# Patient Record
Sex: Female | Born: 1965 | Race: Black or African American | Marital: Single | Smoking: Current every day smoker
Health system: Southern US, Community
[De-identification: ages and names within clinical notes are randomized; demographics above are authoritative.]

## PROBLEM LIST (undated history)

## (undated) DIAGNOSIS — T7840XA Allergy, unspecified, initial encounter: Secondary | ICD-10-CM

## (undated) DIAGNOSIS — M1990 Unspecified osteoarthritis, unspecified site: Secondary | ICD-10-CM

## (undated) HISTORY — DX: Unspecified osteoarthritis, unspecified site: M19.90

## (undated) HISTORY — PX: TUBAL LIGATION: SHX77

## (undated) HISTORY — DX: Allergy, unspecified, initial encounter: T78.40XA

---

## 2017-09-30 ENCOUNTER — Other Ambulatory Visit: Payer: Self-pay

## 2017-09-30 ENCOUNTER — Encounter: Payer: Self-pay | Admitting: Nurse Practitioner

## 2017-09-30 ENCOUNTER — Ambulatory Visit: Payer: BLUE CROSS/BLUE SHIELD | Attending: Nurse Practitioner | Admitting: Nurse Practitioner

## 2017-09-30 VITALS — BP 156/130 | HR 66 | Temp 98.2°F | Resp 16 | Ht 66.5 in | Wt 210.0 lb

## 2017-09-30 DIAGNOSIS — G894 Chronic pain syndrome: Secondary | ICD-10-CM | POA: Insufficient documentation

## 2017-09-30 DIAGNOSIS — M899 Disorder of bone, unspecified: Secondary | ICD-10-CM | POA: Insufficient documentation

## 2017-09-30 DIAGNOSIS — Z882 Allergy status to sulfonamides status: Secondary | ICD-10-CM | POA: Insufficient documentation

## 2017-09-30 DIAGNOSIS — F1190 Opioid use, unspecified, uncomplicated: Secondary | ICD-10-CM | POA: Diagnosis not present

## 2017-09-30 DIAGNOSIS — M25512 Pain in left shoulder: Secondary | ICD-10-CM

## 2017-09-30 DIAGNOSIS — G8929 Other chronic pain: Secondary | ICD-10-CM | POA: Diagnosis not present

## 2017-09-30 DIAGNOSIS — M1990 Unspecified osteoarthritis, unspecified site: Secondary | ICD-10-CM | POA: Insufficient documentation

## 2017-09-30 DIAGNOSIS — M25572 Pain in left ankle and joints of left foot: Secondary | ICD-10-CM | POA: Diagnosis not present

## 2017-09-30 DIAGNOSIS — R6884 Jaw pain: Secondary | ICD-10-CM | POA: Insufficient documentation

## 2017-09-30 DIAGNOSIS — Z5181 Encounter for therapeutic drug level monitoring: Secondary | ICD-10-CM | POA: Diagnosis not present

## 2017-09-30 DIAGNOSIS — M25571 Pain in right ankle and joints of right foot: Secondary | ICD-10-CM | POA: Insufficient documentation

## 2017-09-30 DIAGNOSIS — K069 Disorder of gingiva and edentulous alveolar ridge, unspecified: Secondary | ICD-10-CM | POA: Insufficient documentation

## 2017-09-30 DIAGNOSIS — M79642 Pain in left hand: Secondary | ICD-10-CM | POA: Diagnosis not present

## 2017-09-30 DIAGNOSIS — F17210 Nicotine dependence, cigarettes, uncomplicated: Secondary | ICD-10-CM | POA: Insufficient documentation

## 2017-09-30 DIAGNOSIS — Z79899 Other long term (current) drug therapy: Secondary | ICD-10-CM | POA: Diagnosis not present

## 2017-09-30 DIAGNOSIS — Z79891 Long term (current) use of opiate analgesic: Secondary | ICD-10-CM | POA: Insufficient documentation

## 2017-09-30 DIAGNOSIS — M79641 Pain in right hand: Secondary | ICD-10-CM | POA: Diagnosis not present

## 2017-09-30 DIAGNOSIS — Z789 Other specified health status: Secondary | ICD-10-CM | POA: Diagnosis not present

## 2017-09-30 NOTE — Patient Instructions (Addendum)
____________________________________________________________________________________________  Appointment Policy Summary  It is our goal and responsibility to provide the medical community with assistance in the evaluation and management of patients with chronic pain. Unfortunately our resources are limited. Because we do not have an unlimited amount of time, or available appointments, we are required to closely monitor and manage their use. The following rules exist to maximize their use:  Patient's responsibilities: 1. Punctuality:  At what time should I arrive? You should be physically present in our office 30 minutes before your scheduled appointment. Your scheduled appointment is with your assigned healthcare provider. However, it takes 5-10 minutes to be "checked-in", and another 15 minutes for the nurses to do the admission. If you arrive to our office at the time you were given for your appointment, you will end up being at least 20-25 minutes late to your appointment with the provider. 2. Tardiness:  What happens if I arrive only a few minutes after my scheduled appointment time? You will need to reschedule your appointment. The cutoff is your appointment time. This is why it is so important that you arrive at least 30 minutes before that appointment. If you have an appointment scheduled for 10:00 AM and you arrive at 10:01, you will be required to reschedule your appointment.  3. Plan ahead:  Always assume that you will encounter traffic on your way in. Plan for it. If you are dependent on a driver, make sure they understand these rules and the need to arrive early. 4. Other appointments and responsibilities:  Avoid scheduling any other appointments before or after your pain clinic appointments.  5. Be prepared:  Write down everything that you need to discuss with your healthcare provider and give this information to the admitting nurse. Write down the medications that you will need  refilled. Bring your pills and bottles (even the empty ones), to all of your appointments, except for those where a procedure is scheduled. 6. No children or pets:  Find someone to take care of them. It is not appropriate to bring them in. 7. Scheduling changes:  We request "advanced notification" of any changes or cancellations. 8. Advanced notification:  Defined as a time period of more than 24 hours prior to the originally scheduled appointment. This allows for the appointment to be offered to other patients. 9. Rescheduling:  When a visit is rescheduled, it will require the cancellation of the original appointment. For this reason they both fall within the category of "Cancellations".  10. Cancellations:  They require advanced notification. Any cancellation less than 24 hours before the  appointment will be recorded as a "No Show". 11. No Show:  Defined as an unkept appointment where the patient failed to notify or declare to the practice their intention or inability to keep the appointment.  Corrective process for repeat offenders:  1. Tardiness: Three (3) episodes of rescheduling due to late arrivals will be recorded as one (1) "No Show". 2. Cancellation or reschedule: Three (3) cancellations or rescheduling will be recorded as one (1) "No Show". 3. "No Shows": Three (3) "No Shows" within a 12 month period will result in discharge from the practice.  ____________________________________________________________________________________________   ____________________________________________________________________________________________  Pain Scale  Introduction: The pain score used by this practice is the Verbal Numerical Rating Scale (VNRS-11). This is an 11-point scale. It is for adults and children 10 years or older. There are significant differences in how the pain score is reported, used, and applied. Forget everything you learned in the past  and learn this scoring  system.  General Information: The scale should reflect your current level of pain. Unless you are specifically asked for the level of your worst pain, or your average pain. If you are asked for one of these two, then it should be understood that it is over the past 24 hours.  Basic Activities of Daily Living (ADL): Personal hygiene, dressing, eating, transferring, and using restroom.  Instructions: Most patients tend to report their level of pain as a combination of two factors, their physical pain and their psychosocial pain. This last one is also known as "suffering" and it is reflection of how physical pain affects you socially and psychologically. From now on, report them separately. From this point on, when asked to report your pain level, report only your physical pain. Use the following table for reference.  Pain Clinic Pain Levels (0-5/10)  Pain Level Score  Description  No Pain 0   Mild pain 1 Nagging, annoying, but does not interfere with basic activities of daily living (ADL). Patients are able to eat, bathe, get dressed, toileting (being able to get on and off the toilet and perform personal hygiene functions), transfer (move in and out of bed or a chair without assistance), and maintain continence (able to control bladder and bowel functions). Blood pressure and heart rate are unaffected. A normal heart rate for a healthy adult ranges from 60 to 100 bpm (beats per minute).   Mild to moderate pain 2 Noticeable and distracting. Impossible to hide from other people. More frequent flare-ups. Still possible to adapt and function close to normal. It can be very annoying and may have occasional stronger flare-ups. With discipline, patients may get used to it and adapt.   Moderate pain 3 Interferes significantly with activities of daily living (ADL). It becomes difficult to feed, bathe, get dressed, get on and off the toilet or to perform personal hygiene functions. Difficult to get in and out of  bed or a chair without assistance. Very distracting. With effort, it can be ignored when deeply involved in activities.   Moderately severe pain 4 Impossible to ignore for more than a few minutes. With effort, patients may still be able to manage work or participate in some social activities. Very difficult to concentrate. Signs of autonomic nervous system discharge are evident: dilated pupils (mydriasis); mild sweating (diaphoresis); sleep interference. Heart rate becomes elevated (>115 bpm). Diastolic blood pressure (lower number) rises above 100 mmHg. Patients find relief in laying down and not moving.   Severe pain 5 Intense and extremely unpleasant. Associated with frowning face and frequent crying. Pain overwhelms the senses.  Ability to do any activity or maintain social relationships becomes significantly limited. Conversation becomes difficult. Pacing back and forth is common, as getting into a comfortable position is nearly impossible. Pain wakes you up from deep sleep. Physical signs will be obvious: pupillary dilation; increased sweating; goosebumps; brisk reflexes; cold, clammy hands and feet; nausea, vomiting or dry heaves; loss of appetite; significant sleep disturbance with inability to fall asleep or to remain asleep. When persistent, significant weight loss is observed due to the complete loss of appetite and sleep deprivation.  Blood pressure and heart rate becomes significantly elevated. Caution: If elevated blood pressure triggers a pounding headache associated with blurred vision, then the patient should immediately seek attention at an urgent or emergency care unit, as these may be signs of an impending stroke.    Emergency Department Pain Levels (6-10/10)  Emergency Room Pain  6 Severely limiting. Requires emergency care and should not be seen or managed at an outpatient pain management facility. Communication becomes difficult and requires great effort. Assistance to reach the  emergency department may be required. Facial flushing and profuse sweating along with potentially dangerous increases in heart rate and blood pressure will be evident.   Distressing pain 7 Self-care is very difficult. Assistance is required to transport, or use restroom. Assistance to reach the emergency department will be required. Tasks requiring coordination, such as bathing and getting dressed become very difficult.   Disabling pain 8 Self-care is no longer possible. At this level, pain is disabling. The individual is unable to do even the most "basic" activities such as walking, eating, bathing, dressing, transferring to a bed, or toileting. Fine motor skills are lost. It is difficult to think clearly.   Incapacitating pain 9 Pain becomes incapacitating. Thought processing is no longer possible. Difficult to remember your own name. Control of movement and coordination are lost.   The worst pain imaginable 10 At this level, most patients pass out from pain. When this level is reached, collapse of the autonomic nervous system occurs, leading to a sudden drop in blood pressure and heart rate. This in turn results in a temporary and dramatic drop in blood flow to the brain, leading to a loss of consciousness. Fainting is one of the body's self defense mechanisms. Passing out puts the brain in a calmed state and causes it to shut down for a while, in order to begin the healing process.    Summary: 1. Refer to this scale when providing [place] with your pain level. 2. Be accurate and careful when reporting your pain level. This will help with your care. 3. Over-reporting your pain level will lead to loss of credibility. 4. Even a level of 1/10 means that there is pain and will be treated at our facility. 5. High, inaccurate reporting will be documented as "Symptom Exaggeration", leading to loss of credibility and suspicions of possible secondary gains such as obtaining more narcotics, or wanting to appear  disabled, for fraudulent reasons. 6. Only pain levels of 5 or below will be seen at our facility. 7. Pain levels of 6 and above will be sent to the Emergency Department and the appointment cancelled. ____________________________________________________________________________________________   BMI Assessment: Estimated body mass index is 33.39 kg/m as calculated from the following:   Height as of this encounter: 5' 6.5" (1.689 m).   Weight as of this encounter: 210 lb (95.3 kg).  BMI interpretation table: BMI level Category Range association with higher incidence of chronic pain  <18 kg/m2 Underweight   18.5-24.9 kg/m2 Ideal body weight   25-29.9 kg/m2 Overweight Increased incidence by 20%  30-34.9 kg/m2 Obese (Class I) Increased incidence by 68%  35-39.9 kg/m2 Severe obesity (Class II) Increased incidence by 136%  >40 kg/m2 Extreme obesity (Class III) Increased incidence by 254%   BMI Readings from Last 4 Encounters:  09/30/17 33.39 kg/m   Wt Readings from Last 4 Encounters:  09/30/17 210 lb (95.3 kg)

## 2017-09-30 NOTE — Progress Notes (Signed)
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 the pain score is reported, used, and applied. Forget everything you learned in the past and learn this scoring system.  General Information: The scale should reflect your current level of pain. Unless you are specifically asked for the level of your worst pain, or your average pain. If you are asked for one of these two, then it should be understood that it is over the past 24 hours.  Basic Activities of Daily Living (ADL): Personal hygiene, dressing, eating, transferring, and using restroom.  Instructions: Most patients tend to report their level of pain as a combination of two factors, their physical pain and their psychosocial pain. This last one is also known as "suffering" and it is reflection of how physical pain affects you socially and psychologically. From now on, report them separately. From this point on, when asked to report your pain level, report only your physical pain. Use the following table for reference.  Pain Clinic Pain Levels  (0-5/10)  Pain Level Score  Description  No Pain 0   Mild pain 1 Nagging, annoying, but does not interfere with basic activities of daily living (ADL). Patients are able to eat, bathe, get dressed, toileting (being able to get on and off the toilet and perform personal hygiene functions), transfer (move in and out of bed or a chair without assistance), and maintain continence (able to control bladder and bowel functions). Blood pressure and heart rate are unaffected. A normal heart rate for a healthy adult ranges from 60 to 100 bpm (beats per minute).   Mild to moderate pain 2 Noticeable and distracting. Impossible to hide from other people. More frequent flare-ups. Still possible to adapt and function close to normal. It can be very annoying and may have occasional stronger flare-ups. With discipline, patients may get used to it and adapt.   Moderate pain 3 Interferes significantly with activities of daily living (ADL). It becomes difficult to feed, bathe, get dressed, get on and off the toilet  or to perform personal hygiene functions. Difficult to get in and out of bed or a chair without assistance. Very distracting. With effort, it can be ignored when deeply involved in activities.   Moderately severe pain 4 Impossible to ignore for more than a few minutes. With effort, patients may still be able to manage work or participate in some social activities. Very difficult to concentrate. Signs of autonomic nervous system discharge are evident: dilated pupils (mydriasis); mild sweating (diaphoresis); sleep interference. Heart rate becomes elevated (>115 bpm). Diastolic blood pressure (lower number) rises above 100 mmHg. Patients find relief in laying down and not moving.   Severe pain 5 Intense and extremely unpleasant. Associated with frowning face and frequent crying. Pain overwhelms the senses.  Ability to do any activity or maintain social relationships becomes significantly limited. Conversation  becomes difficult. Pacing back and forth is common, as getting into a comfortable position is nearly impossible. Pain wakes you up from deep sleep. Physical signs will be obvious: pupillary dilation; increased sweating; goosebumps; brisk reflexes; cold, clammy hands and feet; nausea, vomiting or dry heaves; loss of appetite; significant sleep disturbance with inability to fall asleep or to remain asleep. When persistent, significant weight loss is observed due to the complete loss of appetite and sleep deprivation.  Blood pressure and heart rate becomes significantly elevated. Caution: If elevated blood pressure triggers a pounding headache associated with blurred vision, then the patient should immediately seek attention at an urgent or emergency care unit, as these may be signs of an impending stroke.    Emergency Department Pain Levels (6-10/10)  Emergency Room Pain 6 Severely limiting. Requires emergency care and should not be seen or managed at an outpatient pain management facility. Communication becomes difficult and requires great effort. Assistance to reach the emergency department may be required. Facial flushing and profuse sweating along with potentially dangerous increases in heart rate and blood pressure will be evident.   Distressing pain 7 Self-care is very difficult. Assistance is required to transport, or use restroom. Assistance to reach the emergency department will be required. Tasks requiring coordination, such as bathing and getting dressed become very difficult.   Disabling pain 8 Self-care is no longer possible. At this level, pain is disabling. The individual is unable to do even the most "basic" activities such as walking, eating, bathing, dressing, transferring to a bed, or toileting. Fine motor skills are lost. It is difficult to think clearly.   Incapacitating pain 9 Pain becomes incapacitating. Thought processing is no longer possible. Difficult to remember your own name.  Control of movement and coordination are lost.   The worst pain imaginable 10 At this level, most patients pass out from pain. When this level is reached, collapse of the autonomic nervous system occurs, leading to a sudden drop in blood pressure and heart rate. This in turn results in a temporary and dramatic drop in blood flow to the brain, leading to a loss of consciousness. Fainting is one of the body's self defense mechanisms. Passing out puts the brain in a calmed state and causes it to shut down for a while, in order to begin the healing process.    Summary: 1. Refer to this scale when providing [place] with your pain level. 2. Be accurate and careful when reporting your pain level. This will help with your care. 3. Over-reporting your pain level will lead to loss of credibility. 4. Even a level of 1/10 means that there is pain and will be treated  at our facility. 5. High, inaccurate reporting will be documented as "Symptom Exaggeration", leading to loss of credibility and suspicions of possible secondary gains such as obtaining more narcotics, or wanting to appear disabled, for fraudulent reasons. 6. Only pain levels of 5 or below will be seen at our facility. 7. Pain levels of 6 and above will be sent to the Emergency Department and the appointment cancelled. ____________________________________________________________________________________________   BMI Assessment: Estimated body mass index is 33.39 kg/m as calculated from the following:   Height as of this encounter: 5' 6.5" (1.689 m).   Weight as of this encounter: 210 lb (95.3 kg).  BMI interpretation table: BMI level Category Range association with higher incidence of chronic pain  <18 kg/m2 Underweight   18.5-24.9 kg/m2 Ideal body weight   25-29.9 kg/m2 Overweight Increased incidence by 20%  30-34.9 kg/m2 Obese (Class I) Increased incidence by 68%  35-39.9 kg/m2 Severe obesity (Class II) Increased incidence by 136%  >40 kg/m2  Extreme obesity (Class III) Increased incidence by 254%   BMI Readings from Last 4 Encounters:  09/30/17 33.39 kg/m   Wt Readings from Last 4 Encounters:  09/30/17 210 lb (95.3 kg)

## 2017-09-30 NOTE — Progress Notes (Signed)
Safety precautions to be maintained throughout the outpatient stay will include: orient to surroundings, keep bed in low position, maintain call bell within reach at all times, provide assistance with transfer out of bed and ambulation.  

## 2017-10-04 LAB — 25-HYDROXY VITAMIN D LCMS D2+D3
25-Hydroxy, Vitamin D-2: <1 ng/mL
25-Hydroxy, Vitamin D-3: 13 ng/mL
25-Hydroxy, Vitamin D: 14 ng/mL — ABNORMAL LOW

## 2017-10-04 LAB — COMP. METABOLIC PANEL (12)
ALBUMIN: 3.8 g/dL (ref 3.5–5.5)
ALK PHOS: 104 IU/L (ref 39–117)
AST: 16 IU/L (ref 0–40)
Albumin/Globulin Ratio: 1.3 (ref 1.2–2.2)
BILIRUBIN TOTAL: 0.3 mg/dL (ref 0.0–1.2)
BUN/Creatinine Ratio: 15 (ref 9–23)
BUN: 13 mg/dL (ref 6–24)
CREATININE: 0.88 mg/dL (ref 0.57–1.00)
Calcium: 9.1 mg/dL (ref 8.7–10.2)
Chloride: 104 mmol/L (ref 96–106)
GFR calc Af Amer: 88 mL/min/{1.73_m2} (ref >59)
GFR calc non Af Amer: 76 mL/min/{1.73_m2} (ref >59)
GLOBULIN, TOTAL: 2.9 g/dL (ref 1.5–4.5)
Glucose: 86 mg/dL (ref 65–99)
POTASSIUM: 4.5 mmol/L (ref 3.5–5.2)
SODIUM: 141 mmol/L (ref 134–144)
Total Protein: 6.7 g/dL (ref 6.0–8.5)

## 2017-10-04 LAB — MAGNESIUM: MAGNESIUM: 2 mg/dL (ref 1.6–2.3)

## 2017-10-04 LAB — SEDIMENTATION RATE: SED RATE: 48 mm/h — AB (ref 0–40)

## 2017-10-04 LAB — VITAMIN B12: VITAMIN B 12: 441 pg/mL (ref 232–1245)

## 2017-10-04 LAB — C-REACTIVE PROTEIN: CRP: 20.3 mg/L — ABNORMAL HIGH (ref 0.0–4.9)

## 2017-10-04 LAB — COMPLIANCE DRUG ANALYSIS, UR

## 2017-10-06 ENCOUNTER — Encounter: Payer: Self-pay | Admitting: Nurse Practitioner

## 2017-10-06 ENCOUNTER — Telehealth: Payer: Self-pay | Admitting: *Deleted

## 2017-10-06 ENCOUNTER — Other Ambulatory Visit: Payer: Self-pay | Admitting: Nurse Practitioner

## 2017-10-06 DIAGNOSIS — R7982 Elevated C-reactive protein (CRP): Secondary | ICD-10-CM | POA: Insufficient documentation

## 2017-10-06 DIAGNOSIS — E559 Vitamin D deficiency, unspecified: Secondary | ICD-10-CM

## 2017-10-06 DIAGNOSIS — R700 Elevated erythrocyte sedimentation rate: Secondary | ICD-10-CM | POA: Insufficient documentation

## 2017-10-06 MED ORDER — ERGOCALCIFEROL 1.25 MG (50000 UT) PO CAPS: 50000.0000 [IU] | ORAL_CAPSULE | ORAL | 0 refills | Status: AC

## 2017-10-06 NOTE — Telephone Encounter (Signed)
Attempted to call patient to give lab results. Spoke with someone at the home, states patient works until [employer]. She is off Wednesday, will call back then.

## 2017-10-06 NOTE — Telephone Encounter (Signed)
-----   Message from Barbette Merino, NP sent at 10/06/2017 10:01 AM EST ----- Regarding: Vitamin D Please call pt and make aware her aware the her vitamin  D is 14. This is vitamin D deficiency.  I have sent in a weekly vitamin D dose 50,000 units for 12 weeks. Once this RX has been completed have her to start Vitamin D 2000 units OTC daily.  Thanks

## 2017-10-08 ENCOUNTER — Ambulatory Visit
Admission: RE | Admit: 2017-10-08 | Discharge: 2017-10-08 | Disposition: A | Discharge status: Home / Self Care | Payer: BLUE CROSS/BLUE SHIELD | Source: Ambulatory Visit | Attending: Pain Medicine | Admitting: Pain Medicine

## 2017-10-08 ENCOUNTER — Ambulatory Visit
Admission: RE | Admit: 2017-10-08 | Discharge: 2017-10-08 | Disposition: A | Discharge status: Home / Self Care | Payer: BLUE CROSS/BLUE SHIELD | Source: Ambulatory Visit | Attending: Nurse Practitioner | Admitting: Nurse Practitioner

## 2017-10-08 ENCOUNTER — Other Ambulatory Visit: Payer: Self-pay

## 2017-10-08 ENCOUNTER — Ambulatory Visit: Payer: BLUE CROSS/BLUE SHIELD | Attending: Pain Medicine | Admitting: Pain Medicine

## 2017-10-08 ENCOUNTER — Encounter: Payer: Self-pay | Admitting: Pain Medicine

## 2017-10-08 VITALS — BP 173/98 | HR 63 | Temp 98.4°F | Resp 18 | Ht 66.0 in | Wt 240.0 lb

## 2017-10-08 DIAGNOSIS — M79642 Pain in left hand: Secondary | ICD-10-CM

## 2017-10-08 DIAGNOSIS — F17210 Nicotine dependence, cigarettes, uncomplicated: Secondary | ICD-10-CM | POA: Diagnosis not present

## 2017-10-08 DIAGNOSIS — M05742 Rheumatoid arthritis with rheumatoid factor of left hand without organ or systems involvement: Secondary | ICD-10-CM

## 2017-10-08 DIAGNOSIS — M19072 Primary osteoarthritis, left ankle and foot: Secondary | ICD-10-CM

## 2017-10-08 DIAGNOSIS — M7732 Calcaneal spur, left foot: Secondary | ICD-10-CM | POA: Insufficient documentation

## 2017-10-08 DIAGNOSIS — R7982 Elevated C-reactive protein (CRP): Secondary | ICD-10-CM | POA: Insufficient documentation

## 2017-10-08 DIAGNOSIS — M7731 Calcaneal spur, right foot: Secondary | ICD-10-CM | POA: Diagnosis not present

## 2017-10-08 DIAGNOSIS — G894 Chronic pain syndrome: Secondary | ICD-10-CM | POA: Diagnosis not present

## 2017-10-08 DIAGNOSIS — M25572 Pain in left ankle and joints of left foot: Secondary | ICD-10-CM

## 2017-10-08 DIAGNOSIS — R700 Elevated erythrocyte sedimentation rate: Secondary | ICD-10-CM | POA: Insufficient documentation

## 2017-10-08 DIAGNOSIS — M25571 Pain in right ankle and joints of right foot: Secondary | ICD-10-CM | POA: Diagnosis present

## 2017-10-08 DIAGNOSIS — E559 Vitamin D deficiency, unspecified: Secondary | ICD-10-CM | POA: Diagnosis not present

## 2017-10-08 DIAGNOSIS — M25512 Pain in left shoulder: Secondary | ICD-10-CM | POA: Diagnosis not present

## 2017-10-08 DIAGNOSIS — M19071 Primary osteoarthritis, right ankle and foot: Secondary | ICD-10-CM | POA: Insufficient documentation

## 2017-10-08 DIAGNOSIS — Z882 Allergy status to sulfonamides status: Secondary | ICD-10-CM | POA: Insufficient documentation

## 2017-10-08 DIAGNOSIS — M05741 Rheumatoid arthritis with rheumatoid factor of right hand without organ or systems involvement: Secondary | ICD-10-CM | POA: Insufficient documentation

## 2017-10-08 DIAGNOSIS — G8929 Other chronic pain: Secondary | ICD-10-CM | POA: Diagnosis present

## 2017-10-08 DIAGNOSIS — M069 Rheumatoid arthritis, unspecified: Secondary | ICD-10-CM | POA: Diagnosis not present

## 2017-10-08 DIAGNOSIS — M79641 Pain in right hand: Secondary | ICD-10-CM

## 2017-10-08 NOTE — Patient Instructions (Signed)

## 2017-10-08 NOTE — Progress Notes (Signed)
Patient's Name: Heidi Duncan  MRN: [id number]  Referring Provider: No ref. provider found  DOB: 27-Mar-1966  PCP: System, Pcp Not In  DOS: 10/08/2017  Note by: Gaspar Cola, MD  Service setting: Ambulatory outpatient  Specialty: Interventional Pain Management  Location: [facility] (AMB) Pain Management Facility    Patient type: Established   Primary Reason(s) for Visit: Encounter for evaluation before starting new chronic pain management plan of care (Level of risk: moderate) CC: Hand Pain; Shoulder Pain; and Ankle Pain  HPI  Heidi Duncan is a 52 y.o. year old, female patient, who comes today for a follow-up evaluation to review the test results and decide on a treatment plan. She has Chronic hand pain (Primary Area of Pain) (Bilateral) (L>R); Chronic shoulder pain (Secondary Area of Pain) (Left); Chronic ankle pain (Tertiary Area of Pain) (Bilateral) (L>R); Chronic pain syndrome; Opiate use; Pharmacologic therapy; Disorder of skeletal system; Problems influencing health status; Vitamin D deficiency; Elevated sed rate; Elevated C-reactive protein (CRP); Osteoarthritis of ankles (Bilateral); and Rheumatoid arthritis involving both hands with positive rheumatoid factor (HCC) on their problem list. Her primarily concern today is the Hand Pain; Shoulder Pain; and Ankle Pain  Pain Assessment: Location: Right, Left Hand Radiating: radiates up arms. mainly on the left Onset: More than a month ago Duration: Chronic pain Quality: Aching, Burning, Cramping, Constant Severity: 9 /10 (self-reported pain score)  Note: Reported level is inconsistent with clinical observations. Clinically the patient looks like a 2/10 A 2/10 is viewed as "Mild to Moderate" and described as noticeable and distracting. Impossible to hide from other people. More frequent flare-ups. Still possible to adapt and function close to normal. It can be very annoying and may have occasional stronger flare-ups. With discipline, patients may get  used to it and adapt. Information on the proper use of the pain scale provided to the patient today. When using our objective Pain Scale, levels between 6 and 10/10 are said to belong in an emergency room, as it progressively worsens from a 6/10, described as severely limiting, requiring emergency care not usually available at an outpatient pain management facility. At a 6/10 level, communication becomes difficult and requires great effort. Assistance to reach the emergency department may be required. Facial flushing and profuse sweating along with potentially dangerous increases in heart rate and blood pressure will be evident. Timing: Constant Modifying factors: denies  Heidi Duncan comes in today for a follow-up visit after her initial evaluation on 10/06/2017. Today we went over the results of her tests. These were explained in "Layman's terms". During today's appointment we went over my diagnostic impression, as well as the proposed treatment plan.  According to the patient her primary area of pain is in her left hand. She admits that she's been diagnosed with rheumatoid arthritis however is not able to tolerate the current treatment. She denies any previous surgery, interventional therapy or physical therapy.  Her second area pain is in her left shoulder. She denies any previous surgery. She states that she has had steroid injection (2 yrs ago by Ortho) which was effective for a while. She did have some physical therapy which was also effective. She has had recent images.  Her third area of pain is in her ankles. She admits the pain is worse in the mornings. She denies any previous injury, surgery, interventional therapy or physical therapy.  In considering the treatment plan options, Heidi Duncan was reminded that I no longer take patients for medication management only. I asked her  to let me know if she had no intention of taking advantage of the interventional therapies, so that we could make  arrangements to provide this space to someone interested. I also made it clear that undergoing interventional therapies for the purpose of getting pain medications is very inappropriate on the part of a patient, and it will not be tolerated in this practice. This type of behavior would suggest true addiction and therefore it requires referral to an addiction specialist.   Further details on both, my assessment(s), as well as the proposed treatment plan, please see below.  Controlled Substance Pharmacotherapy Assessment REMS (Risk Evaluation and Mitigation Strategy)  Analgesic: None Highest recorded MME/day: 75 mg/day MME/day: 0 mg/day Pill Count: None expected due to no prior prescriptions written by our practice. Heidi Billow, Heidi Duncan  10/08/2017  9:56 AM  Sign at close encounter Safety precautions to be maintained throughout the outpatient stay will include: orient to surroundings, keep bed in low position, maintain call bell within reach at all times, provide assistance with transfer out of bed and ambulation.    Pharmacokinetics: Liberation and absorption (onset of action): N/A Distribution (time to peak effect): N/A Metabolism and excretion (duration of action): N/A         Pharmacodynamics: Desired effects: Analgesia: Heidi Duncan reports no benefit. Functional ability: Patient reports Currently not taking any opioid analgesics Clinically meaningful improvement in function (CMIF): Sustained CMIF goals met Perceived effectiveness: None Undesirable effects: Side-effects or Adverse reactions: N/A Monitoring: Max PMP: Online review of the past 9-monthperiod previously conducted. Not applicable at this point since we have not taken over the patient's medication management yet. List of all UDS test(s) done:  Lab Results  Component Value Date   SUMMARY FINAL 09/30/2017   Last UDS on record: Summary  Date Value Ref Range Status  09/30/2017 FINAL  Final    Comment:     ==================================================================== TOXASSURE COMP DRUG ANALYSIS,UR ==================================================================== Test                             Result       Flag       Units Drug Present not Declared for Prescription Verification   Ibuprofen                      PRESENT      UNEXPECTED   Chlorpheniramine               PRESENT      UNEXPECTED ==================================================================== Test                      Result    Flag   Units      Ref Range   Creatinine              86               mg/dL      >=20 ==================================================================== Declared Medications:  The flagging and interpretation on this report are based on the  following declared medications.  Unexpected results may arise from  inaccuracies in the declared medications.  No medication use reported. ==================================================================== For clinical consultation, please call [phone] ====================================================================    UDS interpretation: No unexpected findings.          Medication Assessment Form: Not applicable. No opioids. Treatment compliance: Not applicable Risk Assessment Profile: Aberrant behavior: See initial evaluations. None observed or detected  today Comorbid factors increasing risk of overdose: See initial evaluation. No additional risks detected today Medical Psychology Evaluation: Please see scanned results in medical record. Opioid Risk Tool - 09/30/17 1337      Family History of Substance Abuse   Alcohol  Negative    Illegal Drugs  Negative    Rx Drugs  Negative      Personal History of Substance Abuse   Alcohol  Negative    Illegal Drugs  Negative    Rx Drugs  Negative      Age   Age between 54-45 years   No      History of Preadolescent Sexual Abuse   History of Preadolescent Sexual Abuse  Negative or  Female      Psychological Disease   Psychological Disease  Negative    Depression  Negative      Total Score   Opioid Risk Tool Scoring  0    Opioid Risk Interpretation  Low Risk      ORT Scoring interpretation table:  Score <3 = Low Risk for SUD  Score between 4-7 = Moderate Risk for SUD  Score >8 = High Risk for Opioid Abuse   Risk Mitigation Strategies:  Patient opioid safety counseling: No controlled substances prescribed. Patient-Prescriber Agreement (PPA): No agreement signed.  Controlled substance notification to other providers: None required. No opioid therapy.  Pharmacologic Plan: No change in therapy, at this time.             Laboratory Chemistry  Inflammation Markers (CRP: Acute Phase) (ESR: Chronic Phase) Lab Results  Component Value Date   CRP 20.3 (H) 09/30/2017   ESRSEDRATE 48 (H) 09/30/2017                 Renal Function Markers Lab Results  Component Value Date   BUN 13 09/30/2017   CREATININE 0.88 09/30/2017   GFRAA 88 09/30/2017   GFRNONAA 76 09/30/2017                 Hepatic Function Markers Lab Results  Component Value Date   AST 16 09/30/2017   ALBUMIN 3.8 09/30/2017   ALKPHOS 104 09/30/2017                 Electrolytes Lab Results  Component Value Date   NA 141 09/30/2017   K 4.5 09/30/2017   CL 104 09/30/2017   CALCIUM 9.1 09/30/2017   MG 2.0 09/30/2017                 Neuropathy Markers Lab Results  Component Value Date   VITAMINB12 441 09/30/2017                 Bone Pathology Markers Lab Results  Component Value Date   25OHVITD1 14 (L) 09/30/2017   25OHVITD2 <1.0 09/30/2017   25OHVITD3 13 09/30/2017                 Note: Lab results reviewed.  Imaging Review  Ankle Imaging: Ankle-R DG Complete:  Results for orders placed in visit on 10/08/17  DG Ankle Complete Right   Narrative CLINICAL DATA:  Bilateral ankle pain.  Rheumatoid arthritis.  EXAM: LEFT ANKLE COMPLETE - 3+ VIEW; RIGHT ANKLE - COMPLETE 3+  VIEW  COMPARISON:  None.  FINDINGS: Bones: No fracture or dislocation. Normal bone mineralization. Small plantar calcaneal spur bilaterally.  Joints: Normal alignment. No erosive changes. 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 Remainder the joint spaces are maintained. Ankle  mortise is intact.  Soft tissue: No soft tissue abnormality. No radiopaque foreign body. No subcutaneous emphysema.  IMPRESSION: 1. 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 2. Bilateral plantar calcaneal spurs.   Electronically Signed   By: Kathreen Devoid   On: 10/08/2017 09:51    Ankle-L DG Complete:  Results for orders placed in visit on 10/08/17  DG Ankle Complete Left   Narrative CLINICAL DATA:  Bilateral ankle pain.  Rheumatoid arthritis.  EXAM: LEFT ANKLE COMPLETE - 3+ VIEW; RIGHT ANKLE - COMPLETE 3+ VIEW  COMPARISON:  None.  FINDINGS: Bones: No fracture or dislocation. Normal bone mineralization. Small plantar calcaneal spur bilaterally.  Joints: Normal alignment. No erosive changes. 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 Remainder the joint spaces are maintained. Ankle mortise is intact.  Soft tissue: No soft tissue abnormality. No radiopaque foreign body. No subcutaneous emphysema.  IMPRESSION: 1. 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 2. Bilateral plantar calcaneal spurs.   Electronically Signed   By: Kathreen Devoid   On: 10/08/2017 09:51    Complexity Note: Imaging results reviewed. Results shared with Heidi Duncan, using Layman's terms.                         Meds   Current Outpatient Medications:  .  ergocalciferol (VITAMIN D2) 50000 units capsule, Take 1 capsule (50,000 Units total) by mouth once a week. X 12 weeks., Disp: 12 capsule, Rfl: 0 .  methotrexate (RHEUMATREX) 2.5 MG tablet, , Disp: , Rfl: 0 .  metroNIDAZOLE (FLAGYL) 500 MG tablet, TK 1 T PO Q 8 H FOR 7 DAYS, Disp: , Rfl: 0  ROS   Constitutional: Denies any fever or chills Gastrointestinal: No reported hemesis, hematochezia, vomiting, or acute GI distress Musculoskeletal: Denies any acute onset joint swelling, redness, loss of ROM, or weakness Neurological: No reported episodes of acute onset apraxia, aphasia, dysarthria, agnosia, amnesia, paralysis, loss of coordination, or loss of consciousness  Allergies  Heidi Duncan is allergic to sulfa antibiotics.  PFSH  Drug: Heidi Duncan  reports that she does not use drugs. Alcohol:  reports that she drinks alcohol. Tobacco:  reports that she has been smoking cigarettes.  She has a 3.75 pack-year smoking history. she has never used smokeless tobacco. Medical:  has a past medical history of Allergy and Arthritis. Surgical: Heidi Duncan  has a past surgical history that includes Tubal ligation. Family: family history is not on file.  Constitutional Exam  General appearance: Well nourished, well developed, and well hydrated. In no apparent acute distress Vitals:   10/08/17 0947  BP: (!) 173/98  Pulse: 63  Resp: 18  Temp: 98.4 F (36.9 C)  SpO2: 98%  Weight: 240 lb (108.9 kg)  Height: 5' 6"  (1.676 m)   BMI Assessment: Estimated body mass index is 38.74 kg/m as calculated from the following:   Height as of this encounter: 5' 6"  (1.676 m).   Weight as of this encounter: 240 lb (108.9 kg).  BMI interpretation table: BMI level Category Range association with higher incidence of chronic pain  <18 kg/m2 Underweight   18.5-24.9 kg/m2 Ideal body weight   25-29.9 kg/m2 Overweight Increased incidence by 20%  30-34.9 kg/m2 Obese (Class I) Increased incidence by 68%  35-39.9 kg/m2 Severe obesity (Class II) Increased incidence by 136%  >40 kg/m2 Extreme obesity (Class III) Increased incidence by 254%   BMI Readings from Last 4 Encounters:  10/08/17 38.74 kg/m  09/30/17 33.39 kg/m  Wt Readings from Last 4 Encounters:  10/08/17 240 lb (108.9 kg)  09/30/17 210 lb (95.3 kg)   Psych/Mental status: Alert, oriented x 3 (person, place, & time)       Eyes: PERLA Respiratory: No evidence of acute respiratory distress  Cervical Spine Area Exam  Skin & Axial Inspection: No masses, redness, edema, swelling, or associated skin lesions Alignment: Symmetrical Functional ROM: Unrestricted ROM      Stability: No instability detected Muscle Tone/Strength: Functionally intact. No obvious neuro-muscular anomalies detected. Sensory (Neurological): Unimpaired Palpation: No palpable anomalies              Upper Extremity (UE) Exam    Side: Right upper extremity  Side: Left upper extremity  Skin & Extremity Inspection: Skin color, temperature, and hair growth are WNL. No peripheral edema or cyanosis. No masses, redness, swelling, asymmetry, or associated skin lesions. No contractures.  Skin & Extremity Inspection: Skin color, temperature, and hair growth are WNL. No peripheral edema or cyanosis. No masses, redness, swelling, asymmetry, or associated skin lesions. No contractures.  Functional ROM: Guarding          Functional ROM: Guarding          Muscle Tone/Strength: Deconditioned  Muscle Tone/Strength: Deconditioned  Sensory (Neurological): Articular pain pattern affecting the hand  Sensory (Neurological): Articular pain pattern affecting the hand  Palpation: No palpable anomalies              Palpation: No palpable anomalies              Specialized Test(s): Deferred         Specialized Test(s): Deferred          Thoracic Spine Area Exam  Skin & Axial Inspection: No masses, redness, or swelling Alignment: Symmetrical Functional ROM: Unrestricted ROM Stability: No instability detected Muscle Tone/Strength: Functionally intact. No obvious neuro-muscular anomalies detected. Sensory (Neurological): Unimpaired Muscle strength & Tone: No palpable anomalies  Lumbar Spine Area Exam  Skin & Axial Inspection: No masses, redness, or swelling Alignment: Symmetrical Functional ROM:  Unrestricted ROM      Stability: No instability detected Muscle Tone/Strength: Functionally intact. No obvious neuro-muscular anomalies detected. Sensory (Neurological): Unimpaired Palpation: No palpable anomalies       Provocative Tests: Lumbar Hyperextension and rotation test: evaluation deferred today       Lumbar Lateral bending test: evaluation deferred today       Patrick's Maneuver: evaluation deferred today                    Gait & Posture Assessment  Ambulation: Unassisted Gait: Relatively normal for age and body habitus Posture: WNL   Lower Extremity Exam    Side: Right lower extremity  Side: Left lower extremity  Skin & Extremity Inspection: Skin color, temperature, and hair growth are WNL. No peripheral edema or cyanosis. No masses, redness, swelling, asymmetry, or associated skin lesions. No contractures.  Skin & Extremity Inspection: Skin color, temperature, and hair growth are WNL. No peripheral edema or cyanosis. No masses, redness, swelling, asymmetry, or associated skin lesions. No contractures.  Functional ROM: Unrestricted ROM          Functional ROM: Unrestricted ROM          Muscle Tone/Strength: Functionally intact. No obvious neuro-muscular anomalies detected.  Muscle Tone/Strength: Functionally intact. No obvious neuro-muscular anomalies detected.  Sensory (Neurological): Unimpaired  Sensory (Neurological): Unimpaired  Palpation: No palpable anomalies  Palpation: No palpable anomalies   Assessment & Plan  Primary Diagnosis & Pertinent Problem List: The primary encounter diagnosis was Chronic pain syndrome. Diagnoses of Chronic hand pain (Primary Area of Pain) (Bilateral) (L>R), Rheumatoid arthritis involving both hands with positive rheumatoid factor (HCC), Chronic shoulder pain (Secondary Area of Pain) (Left), Chronic ankle pain (Tertiary Area of Pain) (Bilateral) (L>R), Chronic ankle pain, bilateral (Tertiary Area of Pain) (L>R), Primary osteoarthritis of both  ankles, Elevated C-reactive protein (CRP), Elevated sed rate, and Vitamin D deficiency were also pertinent to this visit.  Visit Diagnosis: 1. Chronic pain syndrome   2. Chronic hand pain (Primary Area of Pain) (Bilateral) (L>R)   3. Rheumatoid arthritis involving both hands with positive rheumatoid factor (HCC)   4. Chronic shoulder pain (Secondary Area of Pain) (Left)   5. Chronic ankle pain (Tertiary Area of Pain) (Bilateral) (L>R)   6. Chronic ankle pain, bilateral (Tertiary Area of Pain) (L>R)   7. Primary osteoarthritis of both ankles   8. Elevated C-reactive protein (CRP)   9. Elevated sed rate   10. Vitamin D deficiency    Problems updated and reviewed during this visit: No problems updated.  Plan of Care  Pharmacotherapy (Medications Ordered): No orders of the defined types were placed in this encounter.  Procedure Orders    No procedure(s) ordered today   Lab Orders  No laboratory test(s) ordered today   Imaging Orders  No imaging studies ordered today   Referral Orders  No referral(s) requested today    Pharmacological management options:  Opioid Analgesics: I will not be prescribing any opioids at this time Membrane stabilizer: None prescribed at this time Muscle relaxant: None prescribed at this time NSAID: None prescribed at this time Other analgesic(s): None prescribed at this time   Interventional management options: Planned, scheduled, and/or pending:    Pending to bring X-ray and Lab work results done at other places.   Considering:   Diagnostic bilateral small joint injections of the hands  Diagnostic left intra-articular shoulder joint injection  Diagnostic left suprascapular nerve block  Possible left suprascapular nerve RFA  Diagnostic bilateral intra-articular medium size (ankles) joint injections    PRN Procedures:   None at this time   Provider-requested follow-up: Return if symptoms worsen or fail to improve.  No future  appointments.  Primary Care Physician: System, Pcp Not In Location: [facility] Out[facility] Note by: Gaspar Cola, MD Date: 10/08/2017; Time: 11:05 AM

## 2017-10-08 NOTE — Progress Notes (Signed)
Safety precautions to be maintained throughout the outpatient stay will include: orient to surroundings, keep bed in low position, maintain call bell within reach at all times, provide assistance with transfer out of bed and ambulation.  

## 2017-10-09 NOTE — Progress Notes (Signed)
Results were reviewed and found to be: mildly abnormal  No acute injury or pathology identified  Review would suggest interventional pain management techniques may be of benefit 

## 2017-10-09 NOTE — Telephone Encounter (Signed)
Left voicemail with  patient to let her know the message that Crystal sent re; Vitamin D level.  Instructed to please call if she has any questions re; the message.

## 2018-12-02 IMAGING — CR DG ANKLE COMPLETE 3+V*R*
1 series · 3 of 3 positions shown · non-contrast
Comparison: None.

CLINICAL DATA: Bilateral ankle pain.  Rheumatoid arthritis.

EXAM:
LEFT ANKLE COMPLETE - 3+ VIEW; RIGHT ANKLE - COMPLETE 3+ VIEW

[Series 1: dg ankle complete right · 0.14mm/px · 3 of 3 slices shown]
[im 1/3]
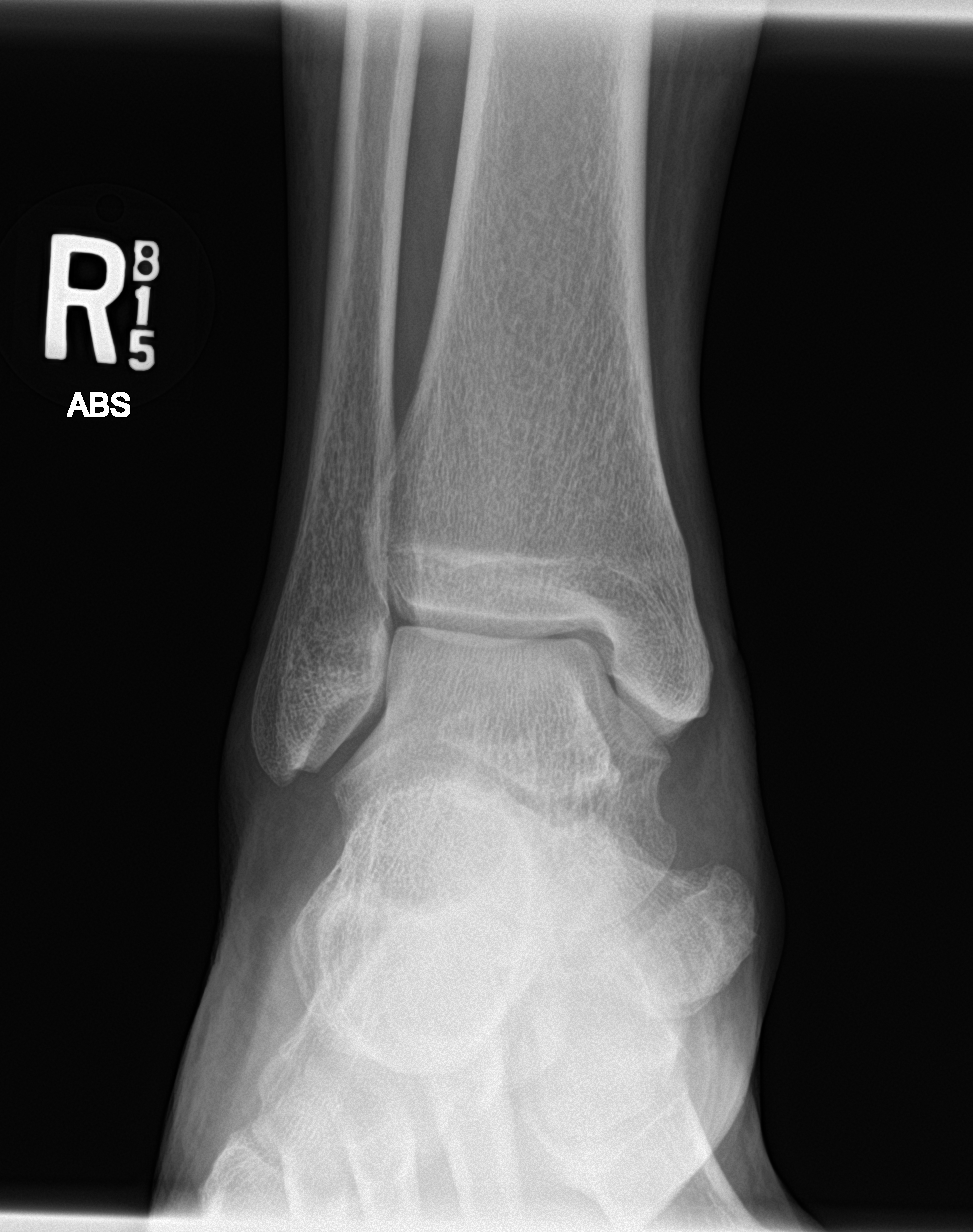
[im 2/3]
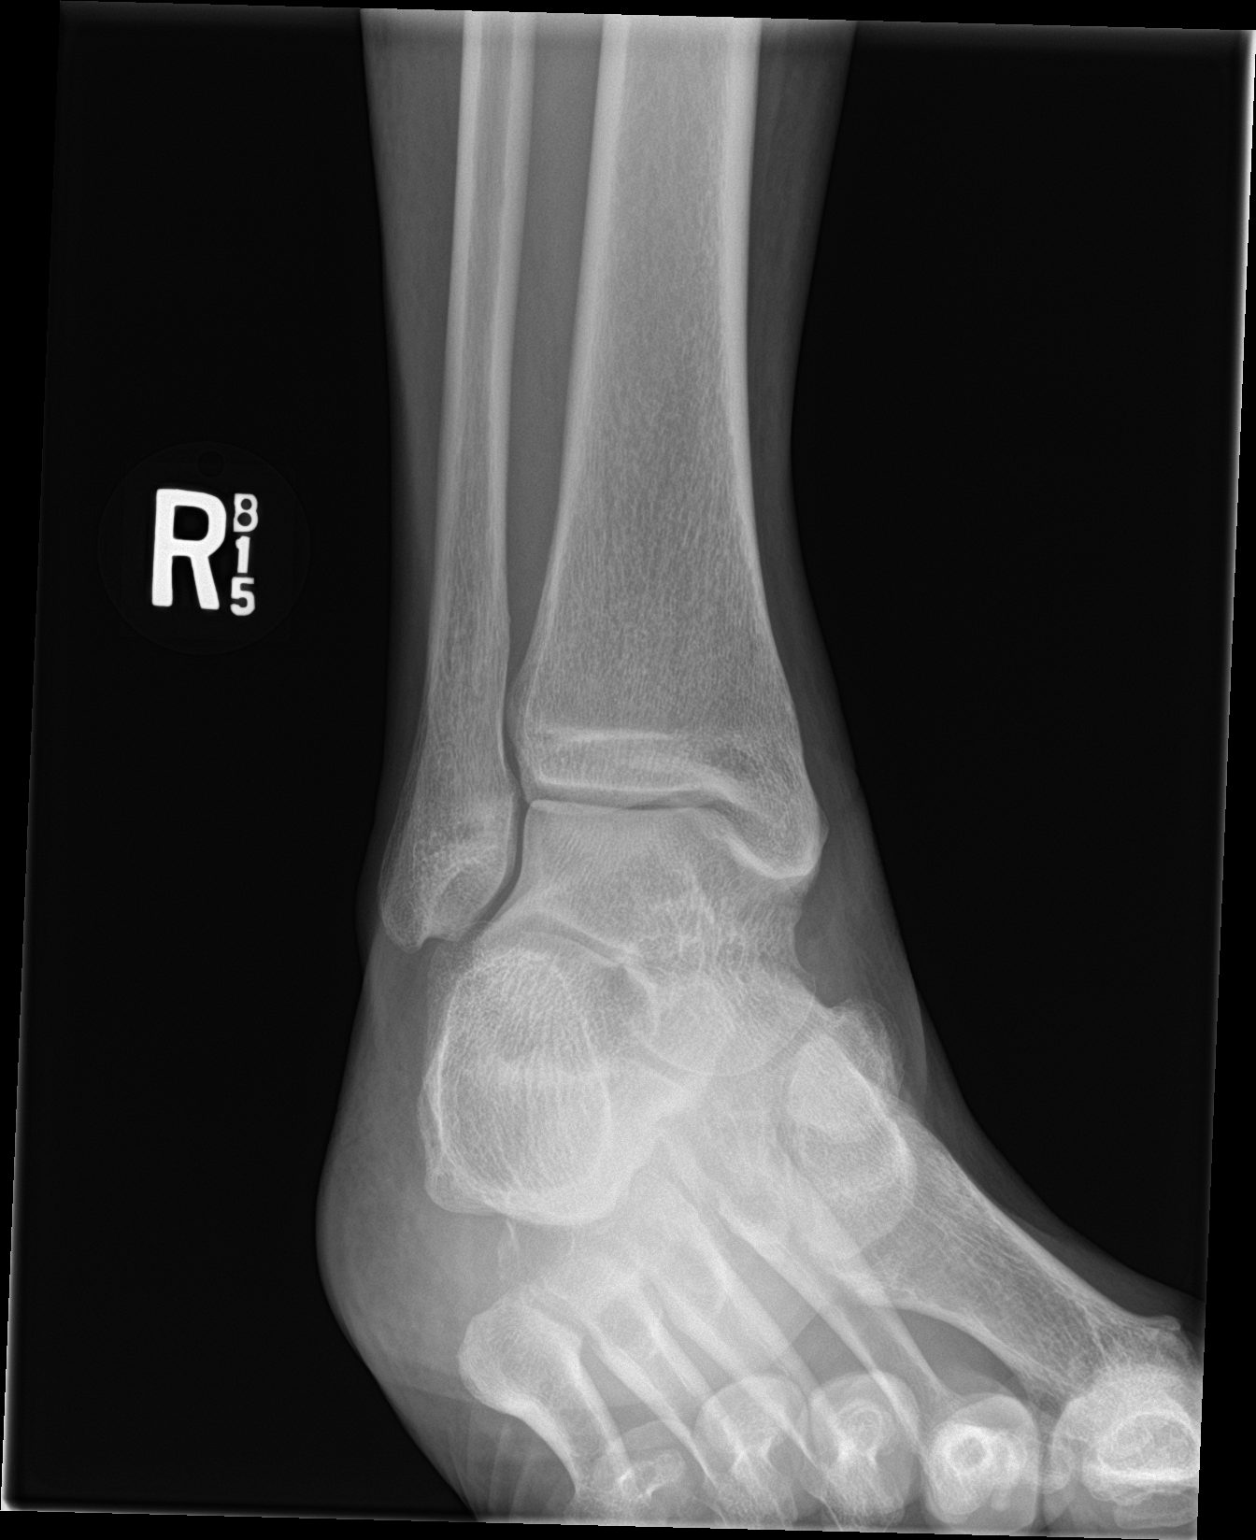
[im 3/3]
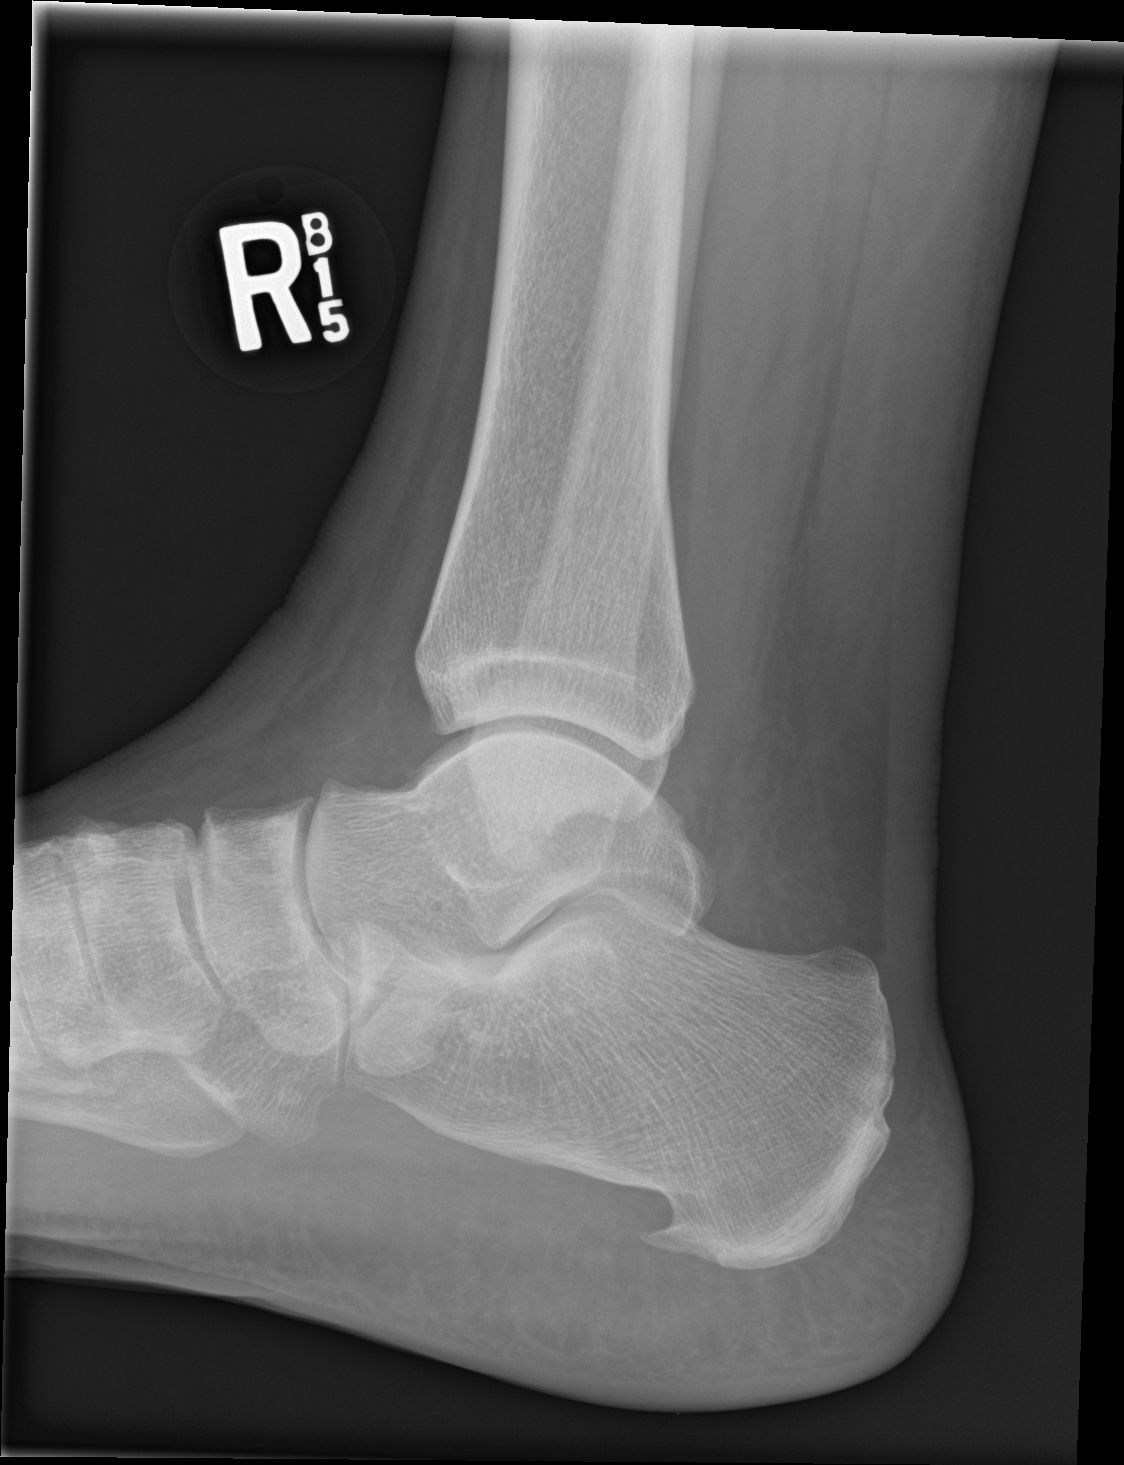

[3 of 3 positions shown; findings below may reference images not displayed]

FINDINGS: Bones: No fracture or dislocation. Normal bone mineralization. Small
plantar calcaneal spur bilaterally.

Joints: Normal alignment. No erosive changes. Mild osteoarthritis of
the talonavicular joint bilaterally. Remainder the joint spaces are
maintained. Ankle mortise is intact.

Soft tissue: No soft tissue abnormality. No radiopaque foreign body.
No subcutaneous emphysema.
IMPRESSION: 1. Mild osteoarthritis of the talonavicular joint bilaterally.
2. Bilateral plantar calcaneal spurs.

## 2018-12-02 IMAGING — CR DG ANKLE COMPLETE 3+V*L*
1 series · 3 of 3 positions shown · non-contrast
Comparison: None.

CLINICAL DATA: Bilateral ankle pain.  Rheumatoid arthritis.

EXAM:
LEFT ANKLE COMPLETE - 3+ VIEW; RIGHT ANKLE - COMPLETE 3+ VIEW

[Series 1: dg ankle complete left · 0.14mm/px · 3 of 3 slices shown]
[im 1/3]
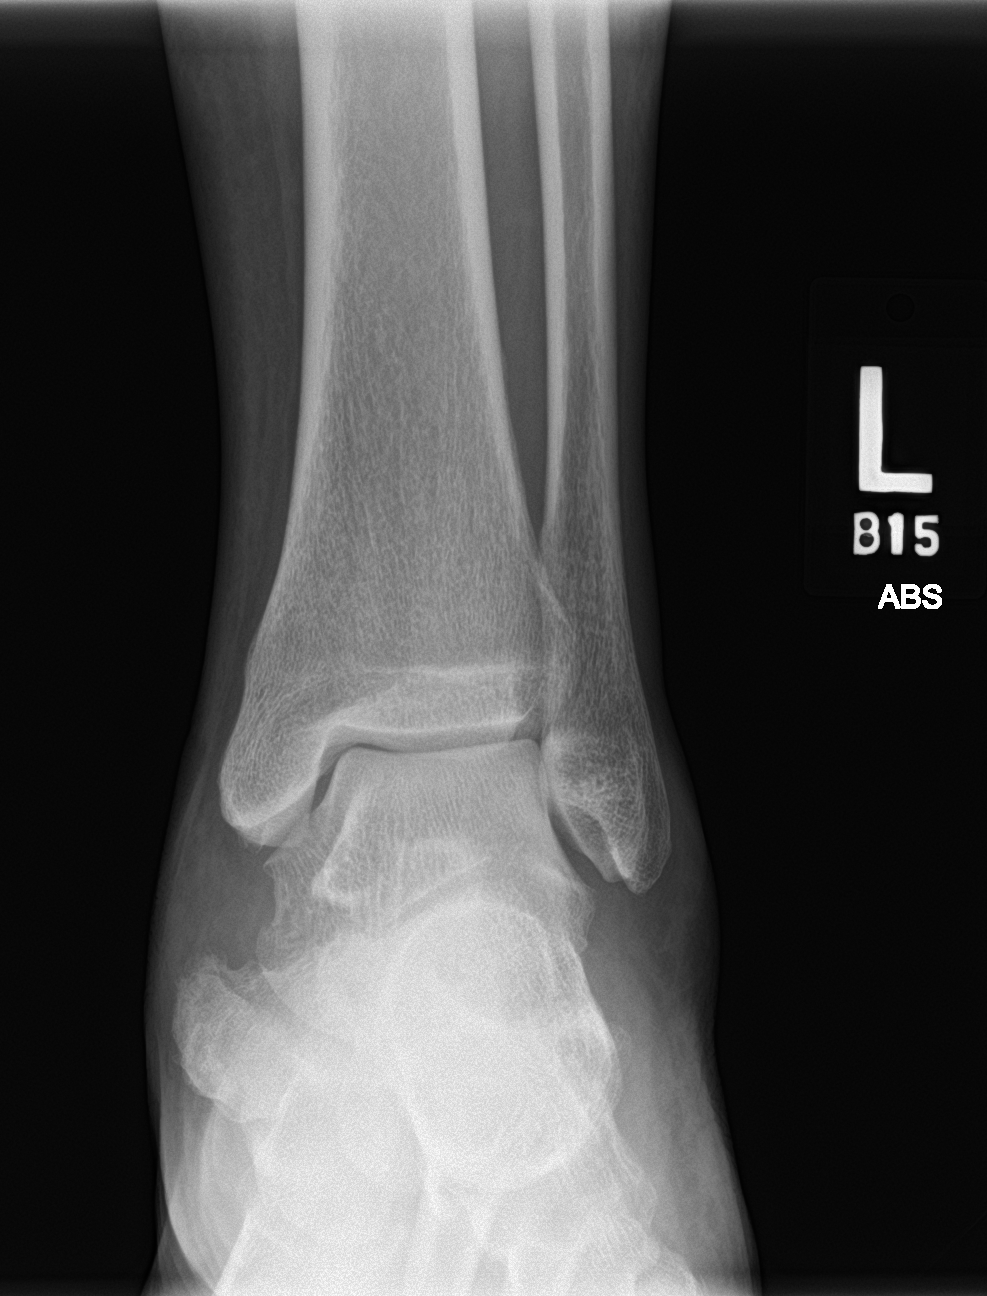
[im 2/3]
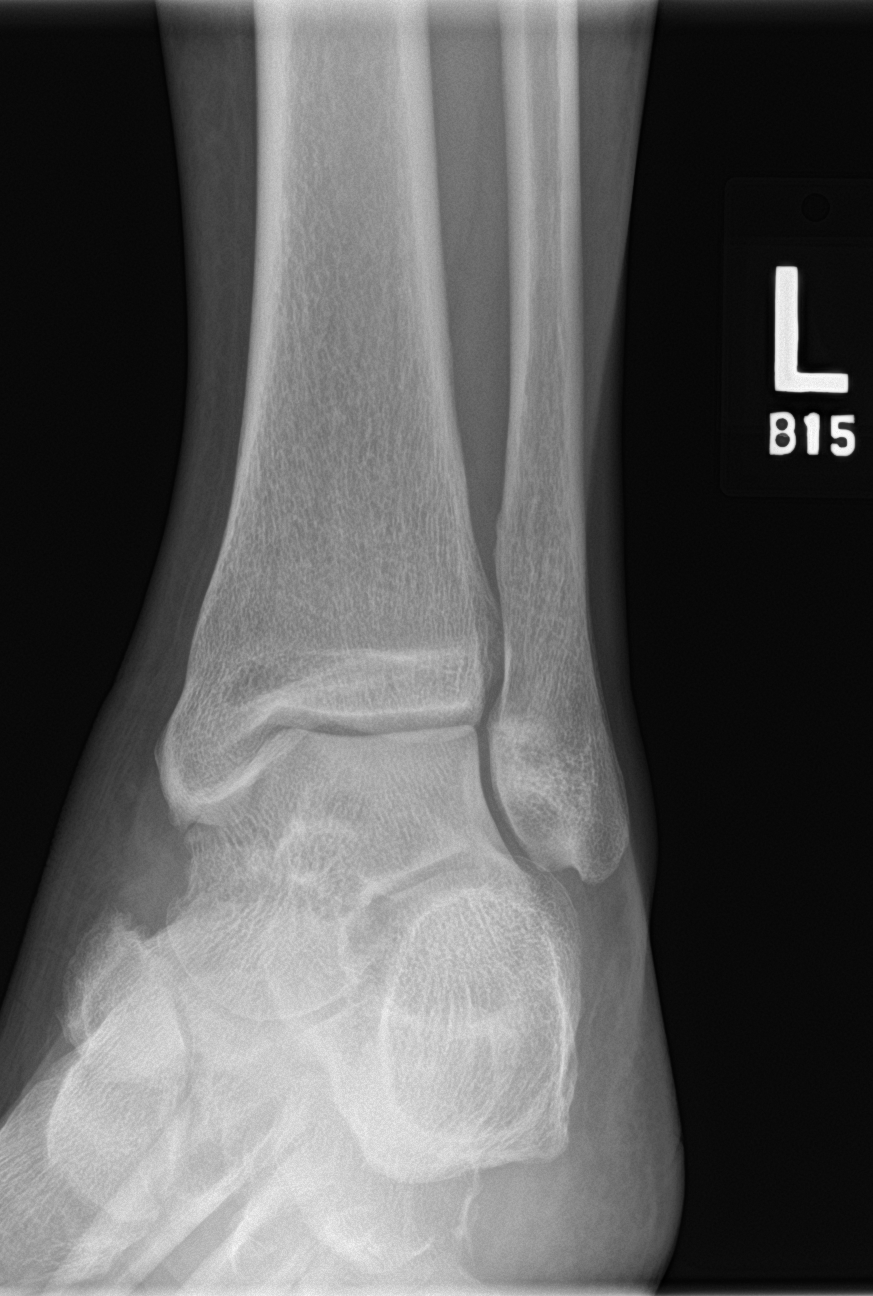
[im 3/3]
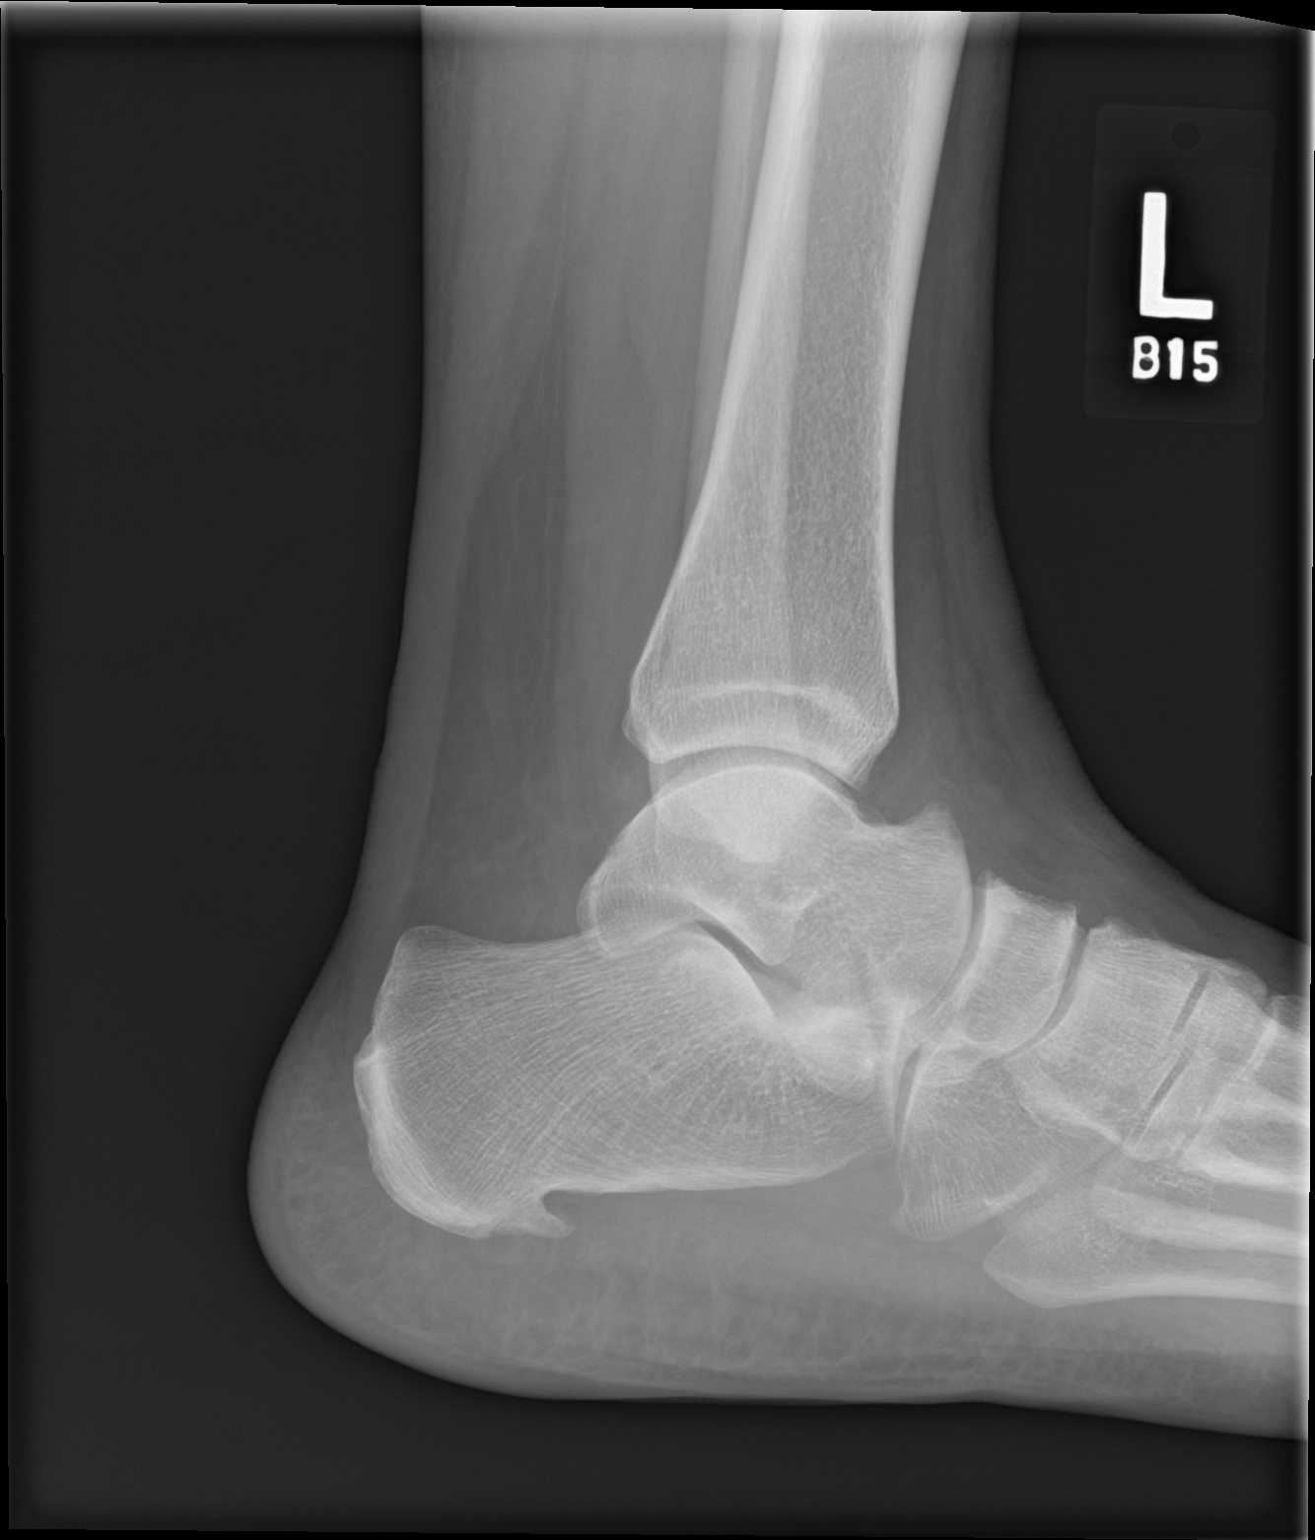

[3 of 3 positions shown; findings below may reference images not displayed]

FINDINGS: Bones: No fracture or dislocation. Normal bone mineralization. Small
plantar calcaneal spur bilaterally.

Joints: Normal alignment. No erosive changes. Mild osteoarthritis of
the talonavicular joint bilaterally. Remainder the joint spaces are
maintained. Ankle mortise is intact.

Soft tissue: No soft tissue abnormality. No radiopaque foreign body.
No subcutaneous emphysema.
IMPRESSION: 1. Mild osteoarthritis of the talonavicular joint bilaterally.
2. Bilateral plantar calcaneal spurs.

## 2020-06-28 DIAGNOSIS — Z1231 Encounter for screening mammogram for malignant neoplasm of breast: Secondary | ICD-10-CM | POA: Diagnosis not present

## 2020-06-28 DIAGNOSIS — E669 Obesity, unspecified: Secondary | ICD-10-CM | POA: Diagnosis not present

## 2020-06-28 DIAGNOSIS — I10 Essential (primary) hypertension: Secondary | ICD-10-CM | POA: Diagnosis not present

## 2020-06-28 DIAGNOSIS — F17200 Nicotine dependence, unspecified, uncomplicated: Secondary | ICD-10-CM | POA: Diagnosis not present

## 2020-09-02 DIAGNOSIS — Z1152 Encounter for screening for COVID-19: Secondary | ICD-10-CM | POA: Diagnosis not present

## 2020-09-02 DIAGNOSIS — T7840XA Allergy, unspecified, initial encounter: Secondary | ICD-10-CM | POA: Diagnosis not present

## 2020-09-02 DIAGNOSIS — R059 Cough, unspecified: Secondary | ICD-10-CM | POA: Diagnosis not present

## 2020-10-31 DIAGNOSIS — M7918 Myalgia, other site: Secondary | ICD-10-CM | POA: Diagnosis not present

## 2020-10-31 DIAGNOSIS — M7061 Trochanteric bursitis, right hip: Secondary | ICD-10-CM | POA: Diagnosis not present

## 2020-10-31 DIAGNOSIS — M549 Dorsalgia, unspecified: Secondary | ICD-10-CM | POA: Diagnosis not present

## 2020-10-31 DIAGNOSIS — M47816 Spondylosis without myelopathy or radiculopathy, lumbar region: Secondary | ICD-10-CM | POA: Diagnosis not present

## 2021-04-04 DIAGNOSIS — F330 Major depressive disorder, recurrent, mild: Secondary | ICD-10-CM | POA: Diagnosis not present

## 2021-04-10 DIAGNOSIS — F330 Major depressive disorder, recurrent, mild: Secondary | ICD-10-CM | POA: Diagnosis not present

## 2021-04-23 DIAGNOSIS — F330 Major depressive disorder, recurrent, mild: Secondary | ICD-10-CM | POA: Diagnosis not present

## 2021-05-14 DIAGNOSIS — F330 Major depressive disorder, recurrent, mild: Secondary | ICD-10-CM | POA: Diagnosis not present

## 2021-05-28 DIAGNOSIS — F330 Major depressive disorder, recurrent, mild: Secondary | ICD-10-CM | POA: Diagnosis not present

## 2021-06-07 DIAGNOSIS — F4310 Post-traumatic stress disorder, unspecified: Secondary | ICD-10-CM | POA: Diagnosis not present

## 2021-06-07 DIAGNOSIS — F330 Major depressive disorder, recurrent, mild: Secondary | ICD-10-CM | POA: Diagnosis not present

## 2021-07-04 DIAGNOSIS — F330 Major depressive disorder, recurrent, mild: Secondary | ICD-10-CM | POA: Diagnosis not present

## 2021-07-04 DIAGNOSIS — F4310 Post-traumatic stress disorder, unspecified: Secondary | ICD-10-CM | POA: Diagnosis not present

## 2021-08-06 DIAGNOSIS — F4310 Post-traumatic stress disorder, unspecified: Secondary | ICD-10-CM | POA: Diagnosis not present

## 2021-08-06 DIAGNOSIS — F330 Major depressive disorder, recurrent, mild: Secondary | ICD-10-CM | POA: Diagnosis not present

## 2021-09-14 DIAGNOSIS — U071 COVID-19: Secondary | ICD-10-CM | POA: Diagnosis not present

## 2021-09-14 DIAGNOSIS — R059 Cough, unspecified: Secondary | ICD-10-CM | POA: Diagnosis not present

## 2021-09-14 DIAGNOSIS — H6502 Acute serous otitis media, left ear: Secondary | ICD-10-CM | POA: Diagnosis not present

## 2021-11-07 DIAGNOSIS — F330 Major depressive disorder, recurrent, mild: Secondary | ICD-10-CM | POA: Diagnosis not present

## 2021-11-07 DIAGNOSIS — F4310 Post-traumatic stress disorder, unspecified: Secondary | ICD-10-CM | POA: Diagnosis not present

## 2021-12-11 DIAGNOSIS — H5789 Other specified disorders of eye and adnexa: Secondary | ICD-10-CM | POA: Diagnosis not present

## 2021-12-11 DIAGNOSIS — L243 Irritant contact dermatitis due to cosmetics: Secondary | ICD-10-CM | POA: Diagnosis not present

## 2022-01-08 DIAGNOSIS — T7840XD Allergy, unspecified, subsequent encounter: Secondary | ICD-10-CM | POA: Diagnosis not present

## 2022-01-28 DIAGNOSIS — R21 Rash and other nonspecific skin eruption: Secondary | ICD-10-CM | POA: Diagnosis not present
